# Patient Record
Sex: Male | Born: 1980 | Race: Black or African American | Hispanic: No | Marital: Single | Smoking: Never smoker
Health system: Southern US, Community
[De-identification: ages and names within clinical notes are randomized; demographics above are authoritative.]

---

## 2005-02-12 ENCOUNTER — Emergency Department: Payer: Self-pay | Admitting: Emergency Medicine

## 2005-04-10 ENCOUNTER — Emergency Department: Payer: Self-pay | Admitting: Emergency Medicine

## 2006-11-09 ENCOUNTER — Emergency Department: Payer: Self-pay | Admitting: Emergency Medicine

## 2009-04-30 ENCOUNTER — Emergency Department: Payer: Self-pay | Admitting: Emergency Medicine

## 2010-05-21 ENCOUNTER — Emergency Department: Payer: Self-pay | Admitting: Emergency Medicine

## 2016-04-07 ENCOUNTER — Encounter (HOSPITAL_COMMUNITY): Payer: Self-pay | Admitting: *Deleted

## 2016-04-07 ENCOUNTER — Emergency Department (HOSPITAL_COMMUNITY)
Admission: EM | Admit: 2016-04-07 | Discharge: 2016-04-07 | Disposition: A | Discharge status: Home / Self Care | Payer: No Typology Code available for payment source | Attending: Emergency Medicine | Admitting: Emergency Medicine

## 2016-04-07 ENCOUNTER — Emergency Department (HOSPITAL_COMMUNITY): Payer: No Typology Code available for payment source

## 2016-04-07 DIAGNOSIS — Y999 Unspecified external cause status: Secondary | ICD-10-CM | POA: Diagnosis not present

## 2016-04-07 DIAGNOSIS — M545 Low back pain, unspecified: Secondary | ICD-10-CM

## 2016-04-07 DIAGNOSIS — Y9389 Activity, other specified: Secondary | ICD-10-CM | POA: Diagnosis not present

## 2016-04-07 DIAGNOSIS — Y92410 Unspecified street and highway as the place of occurrence of the external cause: Secondary | ICD-10-CM | POA: Insufficient documentation

## 2016-04-07 MED ORDER — DICLOFENAC SODIUM 50 MG PO TBEC: 50.0000 mg | DELAYED_RELEASE_TABLET | Freq: Two times a day (BID) | ORAL | Status: DC

## 2016-04-07 MED ORDER — METHOCARBAMOL 500 MG PO TABS: 500.0000 mg | ORAL_TABLET | Freq: Four times a day (QID) | ORAL | Status: DC
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
## 2016-04-07 NOTE — Discharge Instructions (Signed)
°Back Pain, Adult °Back pain is very common in adults. The cause of back pain is rarely dangerous and the pain often gets better over time. The cause of your back pain may not be known. Some common causes of back pain include: °· Strain of the muscles or ligaments supporting the spine. °· Wear and tear (degeneration) of the spinal disks. °· Arthritis. °· Direct injury to the back. °For many people, back pain may return. Since back pain is rarely dangerous, most people can learn to manage this condition on their own. °HOME CARE INSTRUCTIONS °Watch your back pain for any changes. The following actions may help to lessen any discomfort you are feeling: °· Remain active. It is stressful on your back to sit or stand in one place for long periods of time. Do not sit, drive, or stand in one place for more than 30 minutes at a time. Take short walks on even surfaces as soon as you are able. Try to increase the length of time you walk each day. °· Exercise regularly as directed by your health care provider. Exercise helps your back heal faster. It also helps avoid future injury by keeping your muscles strong and flexible. °· Do not stay in bed. Resting more than 1-2 days can delay your recovery. °· Pay attention to your body when you bend and lift. The most comfortable positions are those that put less stress on your recovering back. Always use proper lifting techniques, including: °¨ Bending your knees. °¨ Keeping the load close to your body. °¨ Avoiding twisting. °· Find a comfortable position to sleep. Use a firm mattress and lie on your side with your knees slightly bent. If you lie on your back, put a pillow under your knees. °· Avoid feeling anxious or stressed. Stress increases muscle tension and can worsen back pain. It is important to recognize when you are anxious or stressed and learn ways to manage it, such as with exercise. °· Take medicines only as directed by your health care provider. Over-the-counter  medicines to reduce pain and inflammation are often the most helpful. Your health care provider may prescribe muscle relaxant drugs. These medicines help dull your pain so you can more quickly return to your normal activities and healthy exercise. °· Apply ice to the injured area: °¨ Put ice in a plastic bag. °¨ Place a towel between your skin and the bag. °¨ Leave the ice on for 20 minutes, 2-3 times a day for the first 2-3 days. After that, ice and heat may be alternated to reduce pain and spasms. °· Maintain a healthy weight. Excess weight puts extra stress on your back and makes it difficult to maintain good posture. °SEEK MEDICAL CARE IF: °· You have pain that is not relieved with rest or medicine. °· You have increasing pain going down into the legs or buttocks. °· You have pain that does not improve in one week. °· You have night pain. °· You lose weight. °· You have a fever or chills. °SEEK IMMEDIATE MEDICAL CARE IF:  °· You develop new bowel or bladder control problems. °· You have unusual weakness or numbness in your arms or legs. °· You develop nausea or vomiting. °· You develop abdominal pain. °· You feel faint. °  °This information is not intended to replace advice given to you by your health care provider. Make sure you discuss any questions you have with your health care provider. °  °Document Released: 10/20/2005 Document Revised: 11/10/2014 Document Reviewed: 02/21/2014 °Elsevier Interactive Patient Education ©2016 [employer]   [employer] °Motor Vehicle Collision °It is common to have multiple bruises and sore muscles after a motor vehicle collision (MVC). These tend to feel worse for the first 24 hours. You may have the most stiffness and soreness over the first several hours. You may also feel worse when you wake up the first morning after your collision. After this point, you will usually begin to improve with each day. The speed of improvement often depends on the severity of the collision, the number of  injuries, and the location and nature of these injuries. °HOME CARE INSTRUCTIONS °· Put ice on the injured area. °· Put ice in a plastic bag. °· Place a towel between your skin and the bag. °· Leave the ice on for 15-20 minutes, 3-4 times a day, or as directed by your health care provider. °· Drink enough fluids to keep your urine clear or pale yellow. Do not drink alcohol. °· Take a warm shower or bath once or twice a day. This will increase blood flow to sore muscles. °· You may return to activities as directed by your caregiver. Be careful when lifting, as this may aggravate neck or back pain. °· Only take over-the-counter or prescription medicines for pain, discomfort, or fever as directed by your caregiver. Do not use aspirin. This may increase bruising and bleeding. °SEEK IMMEDIATE MEDICAL CARE IF: °· You have numbness, tingling, or weakness in the arms or legs. °· You develop severe headaches not relieved with medicine. °· You have severe neck pain, especially tenderness in the middle of the back of your neck. °· You have changes in bowel or bladder control. °· There is increasing pain in any area of the body. °· You have shortness of breath, light-headedness, dizziness, or fainting. °· You have chest pain. °· You feel sick to your stomach (nauseous), throw up (vomit), or sweat. °· You have increasing abdominal discomfort. °· There is blood in your urine, stool, or vomit. °· You have pain in your shoulder (shoulder strap areas). °· You feel your symptoms are getting worse. °MAKE SURE YOU: °· Understand these instructions. °· Will watch your condition. °· Will get help right away if you are not doing well or get worse. °  °This information is not intended to replace advice given to you by your health care provider. Make sure you discuss any questions you have with your health care provider. °  °Document Released: 10/20/2005 Document Revised: 11/10/2014 Document Reviewed: 03/19/2011 °Elsevier Interactive  Patient Education ©2016 [employer]vier Inc. ° °
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
## 2019-06-02 ENCOUNTER — Encounter: Payer: Self-pay | Admitting: Emergency Medicine

## 2019-06-02 ENCOUNTER — Other Ambulatory Visit: Payer: Self-pay

## 2019-06-02 ENCOUNTER — Emergency Department: Payer: No Typology Code available for payment source

## 2019-06-02 ENCOUNTER — Emergency Department
Admission: EM | Admit: 2019-06-02 | Discharge: 2019-06-02 | Disposition: A | Discharge status: Home / Self Care | Payer: No Typology Code available for payment source | Attending: Emergency Medicine | Admitting: Emergency Medicine

## 2019-06-02 DIAGNOSIS — Y9389 Activity, other specified: Secondary | ICD-10-CM | POA: Insufficient documentation

## 2019-06-02 DIAGNOSIS — S8992XA Unspecified injury of left lower leg, initial encounter: Secondary | ICD-10-CM | POA: Diagnosis present

## 2019-06-02 DIAGNOSIS — Z79899 Other long term (current) drug therapy: Secondary | ICD-10-CM | POA: Insufficient documentation

## 2019-06-02 DIAGNOSIS — S8002XA Contusion of left knee, initial encounter: Secondary | ICD-10-CM | POA: Insufficient documentation

## 2019-06-02 DIAGNOSIS — Y998 Other external cause status: Secondary | ICD-10-CM | POA: Diagnosis not present

## 2019-06-02 DIAGNOSIS — Y929 Unspecified place or not applicable: Secondary | ICD-10-CM | POA: Insufficient documentation

## 2019-06-02 NOTE — Discharge Instructions (Addendum)
Follow discharge care instruction use over-the-counter ibuprofen or Tylenol as needed for pain.
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
## 2019-07-11 ENCOUNTER — Encounter: Payer: Self-pay | Admitting: Emergency Medicine

## 2019-07-11 ENCOUNTER — Emergency Department: Payer: No Typology Code available for payment source

## 2019-07-11 ENCOUNTER — Emergency Department
Admission: EM | Admit: 2019-07-11 | Discharge: 2019-07-11 | Disposition: A | Discharge status: Home / Self Care | Payer: No Typology Code available for payment source | Attending: Emergency Medicine | Admitting: Emergency Medicine

## 2019-07-11 ENCOUNTER — Other Ambulatory Visit: Payer: Self-pay

## 2019-07-11 DIAGNOSIS — Y929 Unspecified place or not applicable: Secondary | ICD-10-CM | POA: Diagnosis not present

## 2019-07-11 DIAGNOSIS — S29012A Strain of muscle and tendon of back wall of thorax, initial encounter: Secondary | ICD-10-CM | POA: Insufficient documentation

## 2019-07-11 DIAGNOSIS — Y999 Unspecified external cause status: Secondary | ICD-10-CM | POA: Diagnosis not present

## 2019-07-11 DIAGNOSIS — Y939 Activity, unspecified: Secondary | ICD-10-CM | POA: Insufficient documentation

## 2019-07-11 DIAGNOSIS — M436 Torticollis: Secondary | ICD-10-CM | POA: Insufficient documentation

## 2019-07-11 DIAGNOSIS — X58XXXA Exposure to other specified factors, initial encounter: Secondary | ICD-10-CM | POA: Diagnosis not present

## 2019-07-11 DIAGNOSIS — S29019A Strain of muscle and tendon of unspecified wall of thorax, initial encounter: Secondary | ICD-10-CM

## 2019-07-11 DIAGNOSIS — Z20828 Contact with and (suspected) exposure to other viral communicable diseases: Secondary | ICD-10-CM | POA: Diagnosis not present

## 2019-07-11 DIAGNOSIS — S299XXA Unspecified injury of thorax, initial encounter: Secondary | ICD-10-CM | POA: Diagnosis present

## 2019-07-11 LAB — SARS CORONAVIRUS 2 (TAT 6-24 HRS): SARS Coronavirus 2: NEGATIVE

## 2019-07-11 MED ORDER — MELOXICAM 15 MG PO TABS: 15.0000 mg | ORAL_TABLET | Freq: Every day | ORAL | 0 refills | Status: DC

## 2019-07-11 MED ORDER — TIZANIDINE HCL 4 MG PO TABS: 4.0000 mg | ORAL_TABLET | Freq: Three times a day (TID) | ORAL | 0 refills | Status: AC

## 2019-07-11 MED ORDER — KETOROLAC TROMETHAMINE 30 MG/ML IJ SOLN
30.0000 mg | Freq: Once | INTRAMUSCULAR | Status: DC
  Filled 2019-07-11: qty 1

## 2019-07-11 MED ORDER — IBUPROFEN 600 MG PO TABS
600.0000 mg | ORAL_TABLET | Freq: Once | ORAL | Status: AC
  Administered 2019-07-11: 600 mg via ORAL
  Filled 2019-07-11: qty 1

## 2019-07-11 MED ORDER — TRAMADOL HCL 50 MG PO TABS: 50.0000 mg | ORAL_TABLET | Freq: Four times a day (QID) | ORAL | 0 refills | Status: DC | PRN

## 2019-07-11 NOTE — ED Provider Notes (Signed)
[facility] Emergency Department Provider Note ____________________________________________  Time seen: Approximately 10:16 AM  I have reviewed the triage vital signs and the nursing notes.   HISTORY  Chief Complaint Back Pain    HPI Alan Villanueva is a 38 y.o. male who presents to the emergency department for evaluation and treatment of pain in neck, shoulder and behind the right shoulder blade. He awakened with the pain several days ago. No relief with Aleve.   Patient also requests COVID 19 testing. He is asymptomatic without known exposure.  History reviewed. No pertinent past medical history.  There are no active problems to display for this patient.   History reviewed. No pertinent surgical history.  Prior to Admission medications   Medication Sig Start Date End Date Taking? Authorizing Provider  meloxicam (MOBIC) 15 MG tablet Take 1 tablet (15 mg total) by mouth daily. 07/11/19   Nataliyah Packham B, FNP  tiZANidine (ZANAFLEX) 4 MG tablet Take 1 tablet (4 mg total) by mouth 3 (three) times daily. 07/11/19 07/10/20  Ahan Eisenberger, Kasandra Knudsenari B, FNP  traMADol (ULTRAM) 50 MG tablet Take 1 tablet (50 mg total) by mouth every 6 (six) hours as needed. 07/11/19   Chinita Pesterriplett, Keylon Labelle B, FNP    Allergies Patient has no known allergies.  No family history on file.  Social History Social History   Tobacco Use  . Smoking status: Never Smoker  . Smokeless tobacco: Never Used  Substance Use Topics  . Alcohol use: No  . Drug use: Yes    Types: Marijuana    Review of Systems Constitutional: Negative for fever. Cardiovascular: Negative for chest pain. Respiratory: Negative for shortness of breath. Musculoskeletal: Positive for neck, shoulder, and back pain Skin: Negative for open wounds/lesions.  Neurological: Negative for decrease in sensation  ____________________________________________   PHYSICAL EXAM:  VITAL SIGNS: ED Triage Vitals [07/11/19 0953]  Enc  Vitals Group     BP 129/67     Pulse Rate 77     Resp 16     Temp 99 F (37.2 C)     Temp Source Oral     SpO2 99 %     Weight 167 lb (75.8 kg)     Height 5\' 6"  (1.676 m)     Head Circumference      Peak Flow      Pain Score 9     Pain Loc      Pain Edu?      Excl. in GC?     Constitutional: Alert and oriented. Well appearing and in no acute distress. Eyes: Conjunctivae are clear without discharge or drainage Head: Atraumatic Neck: Pain with ROM. Respiratory: No cough. Respirations are even and unlabored. Musculoskeletal: Subscapular tenderness and tenderness over the paraspinal muscles of the mid thoracic area.  Neurologic: Awake, alert, and oriented.  Skin: No wounds or lesions over area of concern.  Psychiatric: Affect and behavior are appropriate.  ____________________________________________   LABS (all labs ordered are listed, but only abnormal results are displayed)  Labs Reviewed  SARS CORONAVIRUS 2 (TAT 6-24 HRS)   ____________________________________________  RADIOLOGY  Image of the thoracic spine is negative for acute findings per radiology. ____________________________________________   PROCEDURES  Procedures  ____________________________________________   INITIAL IMPRESSION / ASSESSMENT AND PLAN / ED COURSE  Alan Villanueva is a 38 y.o. who presents to the emergency department for with moderate evaluation of neck, right shoulder, and right side thoracic back pain.  See HPI for further details.  Patient refused injection of Toradol.  Ibuprofen was given.  He will be discharged home with prescriptions for Zanaflex, tramadol, and meloxicam.   COVID-19 test will be performed prior to discharge.  Patient is to follow-up with primary care provider of his choice or return to the emergency department for symptoms of concern.  Medications  ketorolac (TORADOL) 30 MG/ML injection 30 mg (30 mg Intramuscular Not Given 07/11/19 1042)  ibuprofen (ADVIL)  tablet 600 mg (600 mg Oral Given 07/11/19 1055)    Pertinent labs & imaging results that were available during my care of the patient were reviewed by me and considered in my medical decision making (see chart for details).  _________________________________________   FINAL CLINICAL IMPRESSION(S) / ED DIAGNOSES  Final diagnoses:  Acute torticollis  Thoracic myofascial strain, initial encounter    ED Discharge Orders         Ordered    tiZANidine (ZANAFLEX) 4 MG tablet  3 times daily     07/11/19 1053    traMADol (ULTRAM) 50 MG tablet  Every 6 hours PRN     07/11/19 1053    meloxicam (MOBIC) 15 MG tablet  Daily     07/11/19 1053           If controlled substance prescribed during this visit, 12 month history viewed on the [facility] prior to issuing an initial prescription for Schedule II or III opiod.   Victorino Dike, FNP 07/11/19 1119    Harvest Dark, MD 07/12/19 1408
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
## 2019-07-11 NOTE — Discharge Instructions (Signed)
Please follow up with the primary care provider of your choice or return to the ER for symptoms of concern.  Apply ice to the neck and back off an on today.

## 2019-07-11 NOTE — ED Notes (Signed)
See triage note  Presents with upper back pain for about 1 week  States pain started at neck and moves into upper back and anterior shoulder area  Also states he wants to be tested for COVID  States he has not been exposed but his wife was but she test neg
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
## 2020-10-05 ENCOUNTER — Ambulatory Visit: Payer: Self-pay

## 2020-10-08 ENCOUNTER — Other Ambulatory Visit: Payer: Self-pay

## 2020-10-08 ENCOUNTER — Ambulatory Visit: Payer: Self-pay

## 2020-10-08 ENCOUNTER — Emergency Department
Admission: EM | Admit: 2020-10-08 | Discharge: 2020-10-08 | Disposition: A | Discharge status: Home / Self Care | Payer: Self-pay | Attending: Student in an Organized Health Care Education/Training Program | Admitting: Student in an Organized Health Care Education/Training Program

## 2020-10-08 DIAGNOSIS — J029 Acute pharyngitis, unspecified: Secondary | ICD-10-CM | POA: Insufficient documentation

## 2020-10-08 DIAGNOSIS — R197 Diarrhea, unspecified: Secondary | ICD-10-CM | POA: Insufficient documentation

## 2020-10-08 DIAGNOSIS — A09 Infectious gastroenteritis and colitis, unspecified: Secondary | ICD-10-CM

## 2020-10-08 DIAGNOSIS — R109 Unspecified abdominal pain: Secondary | ICD-10-CM | POA: Insufficient documentation

## 2020-10-08 LAB — URINALYSIS, ROUTINE W REFLEX MICROSCOPIC
Bacteria, UA: NONE SEEN
Bilirubin Urine: NEGATIVE
Glucose, UA: NEGATIVE mg/dL
Ketones, ur: NEGATIVE mg/dL
Leukocytes,Ua: NEGATIVE
Nitrite: NEGATIVE
Protein, ur: NEGATIVE mg/dL
Specific Gravity, Urine: 1.019 (ref 1.005–1.030)
Squamous Epithelial / HPF: NONE SEEN (ref 0–5)
pH: 5 (ref 5.0–8.0)

## 2020-10-08 LAB — BASIC METABOLIC PANEL
Anion gap: 8 (ref 5–15)
BUN: 8 mg/dL (ref 6–20)
CO2: 26 mmol/L (ref 22–32)
Calcium: 9.3 mg/dL (ref 8.9–10.3)
Chloride: 104 mmol/L (ref 98–111)
Creatinine, Ser: 1.13 mg/dL (ref 0.61–1.24)
GFR, Estimated: >60 mL/min (ref >60)
Glucose, Bld: 91 mg/dL (ref 70–99)
Potassium: 3.8 mmol/L (ref 3.5–5.1)
Sodium: 138 mmol/L (ref 135–145)

## 2020-10-08 LAB — CBC
HCT: 37.3 % — ABNORMAL LOW (ref 39.0–52.0)
Hemoglobin: 12.5 g/dL — ABNORMAL LOW (ref 13.0–17.0)
MCH: 32.4 pg (ref 26.0–34.0)
MCHC: 33.5 g/dL (ref 30.0–36.0)
MCV: 96.6 fL (ref 80.0–100.0)
Platelets: 275 10*3/uL (ref 150–400)
RBC: 3.86 MIL/uL — ABNORMAL LOW (ref 4.22–5.81)
RDW: 12.9 % (ref 11.5–15.5)
WBC: 7.8 10*3/uL (ref 4.0–10.5)
nRBC: 0 % (ref 0.0–0.2)

## 2020-10-08 LAB — LIPASE, BLOOD: Lipase: 78 U/L — ABNORMAL HIGH (ref 11–51)

## 2020-10-08 LAB — GROUP A STREP BY PCR: Group A Strep by PCR: NOT DETECTED

## 2020-10-08 MED ORDER — AMOXICILLIN 875 MG PO TABS: 875.0000 mg | ORAL_TABLET | Freq: Two times a day (BID) | ORAL | 0 refills | Status: AC

## 2020-10-08 MED ORDER — AMOXICILLIN 875 MG PO TABS: 875.0000 mg | ORAL_TABLET | Freq: Two times a day (BID) | ORAL | 0 refills | Status: DC

## 2020-10-08 MED ORDER — AMOXICILLIN 500 MG PO CAPS
500.0000 mg | ORAL_CAPSULE | Freq: Once | ORAL | Status: AC
  Administered 2020-10-08: 500 mg via ORAL
  Filled 2020-10-08: qty 1

## 2020-10-08 NOTE — ED Triage Notes (Signed)
Pt here with a sore throat and abd pain. Pt states that he has had diarrhea for a week. Pt NAD in triage.

## 2020-10-08 NOTE — Discharge Instructions (Signed)
Follow-up with your regular doctor if not improving in 2 to 3 days.  Return emergency department worsening.  Take amoxicillin as prescribed.

## 2020-10-08 NOTE — ED Provider Notes (Signed)
[facility] Emergency Department Provider Note  ____________________________________________   First MD Initiated Contact with Patient 10/08/20 2156     (approximate)  I have reviewed the triage vital signs and the nursing notes.   HISTORY  Chief Complaint Sore Throat    HPI Alan Villanueva is a 39 y.o. male presents emergency department with sore throat and abdominal pain.  Patient states he has had diarrhea for approximately 1 week.  Sore throat with white patches.  Noticed the white patches today.  No known Covid exposure.  States he gets Covid test frequently at work and it was negative.  Denies chest pain shortness of breath or vomiting.  States diarrhea has just been loose stools.    No past medical history on file.  There are no problems to display for this patient.   No past surgical history on file.  Prior to Admission medications   Medication Sig Start Date End Date Taking? Authorizing Provider  amoxicillin (AMOXIL) 875 MG tablet Take 1 tablet (875 mg total) by mouth 2 (two) times daily. 10/08/20   Faythe Ghee, PA-C    Allergies Patient has no known allergies.  No family history on file.  Social History Social History   Tobacco Use  . Smoking status: Never Smoker  . Smokeless tobacco: Never Used  Substance Use Topics  . Alcohol use: No  . Drug use: Yes    Types: Marijuana    Review of Systems  Constitutional: No fever/chills Eyes: No visual changes. ENT: Positive sore throat. Respiratory: Denies cough Cardiovascular: Denies chest pain Gastrointestinal: Positive abdominal pain, positive diarrhea Genitourinary: Negative for dysuria. Musculoskeletal: Negative for back pain. Skin: Negative for rash. Psychiatric: no mood changes,     ____________________________________________   PHYSICAL EXAM:  VITAL SIGNS: ED Triage Vitals  Enc Vitals Group     BP 10/08/20 1732 133/71     Pulse Rate 10/08/20 1732 65      Resp 10/08/20 1732 18     Temp 10/08/20 1732 98.9 F (37.2 C)     Temp Source 10/08/20 1732 Oral     SpO2 10/08/20 1732 98 %     Weight 10/08/20 1731 161 lb (73 kg)     Height 10/08/20 1731 5\' 6"  (1.676 m)     Head Circumference --      Peak Flow --      Pain Score 10/08/20 1731 5     Pain Loc --      Pain Edu? --      Excl. in [place]? --     Constitutional: Alert and oriented. Well appearing and in no acute distress. Eyes: Conjunctivae are normal.  Head: Atraumatic. Nose: No congestion/rhinnorhea. Mouth/Throat: Mucous membranes are moist.   Neck:  supple no lymphadenopathy noted Cardiovascular: Normal rate, regular rhythm. Heart sounds are normal Respiratory: Normal respiratory effort.  No retractions, lungs c t a  Abd: soft minimally tender in lower quadrants bilaterally, no McBurney's point tenderness noted, bs normal all 4 quad GU: deferred Musculoskeletal: FROM all extremities, warm and well perfused Neurologic:  Normal speech and language.  Skin:  Skin is warm, dry and intact. No rash noted. Psychiatric: Mood and affect are normal. Speech and behavior are normal.  ____________________________________________   LABS (all labs ordered are listed, but only abnormal results are displayed)  Labs Reviewed  CBC - Abnormal; Notable for the following components:      Result Value   RBC 3.86 (*)  Hemoglobin 12.5 (*)    HCT 37.3 (*)    All other components within normal limits  LIPASE, BLOOD - Abnormal; Notable for the following components:   Lipase 78 (*)    All other components within normal limits  URINALYSIS, ROUTINE W REFLEX MICROSCOPIC - Abnormal; Notable for the following components:   Color, Urine YELLOW (*)    APPearance CLEAR (*)    Hgb urine dipstick SMALL (*)    All other components within normal limits  GROUP A STREP BY PCR  BASIC METABOLIC PANEL    ____________________________________________   ____________________________________________  RADIOLOGY    ____________________________________________   PROCEDURES  Procedure(s) performed: No  Procedures    ____________________________________________   INITIAL IMPRESSION / ASSESSMENT AND PLAN / ED COURSE  Pertinent labs & imaging results that were available during my care of the patient were reviewed by me and considered in my medical decision making (see chart for details).   Patient is a 39 year old male presents with sore throat and diarrhea.  See HPI.  Patient does admit to drinking a lot of alcohol over the weekend.  Physical exam shows patient to appear stable.  Vitals normal.  Throat is red and swollen, lower quads are tender bilaterally no McBurney's point tenderness noted.  DDx: Strep throat, acute gastroenteritis, Covid, viral syndrome  CBC is basically normal, basic metabolic panel is normal, urinalysis is normal, lipase is slightly elevated at 78 which would correspond to the amount of alcohol he drinks weekend.  Strep test is negative  Did not really do a Covid test at this time as patient had one recently.  I do feel this is mostly a viral entity with concerns of strep throat that was not detected on strep test.  He was given amoxicillin while here in the ED.  Prescription for amoxicillin.  He is to follow-up with his regular doctor if not improving in 2 to 3 days.  Return emergency department if worsening.  He is to drink fluids.  Take over-the-counter Imodium A-D and eat a BRAT diet.  Also encouraged him to eat yogurt or take a probiotic while taking the antibiotic for the pharyngitis.  He states he understands.  He was discharged stable condition and given a work note.     Alan Villanueva was evaluated in Emergency Department on 10/08/2020 for the symptoms described in the history of present illness. He was evaluated in the context of the global COVID-19  pandemic, which necessitated consideration that the patient might be at risk for infection with the SARS-CoV-2 virus that causes COVID-19. Institutional protocols and algorithms that pertain to the evaluation of patients at risk for COVID-19 are in a state of rapid change based on information released by regulatory bodies including the CDC and federal and state organizations. These policies and algorithms were followed during the patient's care in the ED.    As part of my medical decision making, I reviewed the following data within the electronic MEDICAL RECORD NUMBER Nursing notes reviewed and incorporated, Labs reviewed , Old chart reviewed, Notes from prior ED visits and [place] Controlled Substance Database  ____________________________________________   FINAL CLINICAL IMPRESSION(S) / ED DIAGNOSES  Final diagnoses:  Acute pharyngitis, unspecified etiology  Diarrhea of infectious origin      NEW MEDICATIONS STARTED DURING THIS VISIT:  Discharge Medication List as of 10/08/2020 10:07 PM       Note:  This document was prepared using Dragon voice recognition software and may include unintentional dictation errors.  Faythe Ghee, PA-C 10/08/20 2242    Willy Eddy, MD 10/09/20 [phone]

## 2022-08-15 ENCOUNTER — Emergency Department: Payer: 59

## 2022-08-15 ENCOUNTER — Encounter: Payer: Self-pay | Admitting: Emergency Medicine

## 2022-08-15 ENCOUNTER — Other Ambulatory Visit: Payer: Self-pay

## 2022-08-15 ENCOUNTER — Emergency Department
Admission: EM | Admit: 2022-08-15 | Discharge: 2022-08-15 | Disposition: A | Discharge status: Home / Self Care | Payer: 59 | Attending: Student in an Organized Health Care Education/Training Program | Admitting: Student in an Organized Health Care Education/Training Program

## 2022-08-15 DIAGNOSIS — J069 Acute upper respiratory infection, unspecified: Secondary | ICD-10-CM | POA: Insufficient documentation

## 2022-08-15 DIAGNOSIS — R059 Cough, unspecified: Secondary | ICD-10-CM | POA: Diagnosis present

## 2022-08-15 DIAGNOSIS — Z20822 Contact with and (suspected) exposure to covid-19: Secondary | ICD-10-CM | POA: Diagnosis not present

## 2022-08-15 DIAGNOSIS — J22 Unspecified acute lower respiratory infection: Secondary | ICD-10-CM

## 2022-08-15 LAB — RESP PANEL BY RT-PCR (FLU A&B, COVID) ARPGX2
Influenza A by PCR: NEGATIVE
Influenza B by PCR: NEGATIVE
SARS Coronavirus 2 by RT PCR: NEGATIVE

## 2022-08-15 MED ORDER — ALBUTEROL SULFATE HFA 108 (90 BASE) MCG/ACT IN AERS: 2.0000 | INHALATION_SPRAY | Freq: Four times a day (QID) | RESPIRATORY_TRACT | 2 refills | Status: AC | PRN

## 2022-08-15 NOTE — Discharge Instructions (Signed)
-  You may utilize the inhaler as needed for cough/shortness of breath.  -You may utilize other over the counter medications as needed for sinus congestion, body aches, or cough.  -Return to the emergency department at any time if you begin to experience any new or worsening symptoms.

## 2022-08-15 NOTE — ED Triage Notes (Signed)
C/O  mid chest pain x 2 weeks.  Usually when working in the mornings.  States this morning felt as if he could not take a deep breath and deep breathing caused the chest pain.  Denies cough.  AAOx3.  Skin warm and dry. NAD  Significant other has covid -- tested positive

## 2022-08-15 NOTE — ED Provider Notes (Signed)
[facility] Provider Note    Event Date/Time   First MD Initiated Contact with Patient 08/15/22 1442     (approximate)   History   Chief Complaint No chief complaint on file.   HPI Alan Villanueva is a 41 y.o. male, no significant medical history, presents to the ED for evaluation of chest pain x2 weeks.  Patient states that he has been having cough, congestion, and tightness with deep breathing over the past week.  He states that his significant other has tested positive for COVID-19 and believes he may have a 2.  Denies fever/chills, abdominal pain, flank pain, nausea/vomiting, diarrhea, dysuria, headache, dizziness/lightheadedness, rash/lesions, or numbness/tingling upper or lower extremities.  History Limitations: No limitations.        Physical Exam  Triage Vital Signs: ED Triage Vitals  Enc Vitals Group     BP 08/15/22 1420 118/78     Pulse Rate 08/15/22 1420 62     Resp 08/15/22 1420 16     Temp 08/15/22 1420 98.6 F (37 C)     Temp Source 08/15/22 1420 Oral     SpO2 08/15/22 1420 95 %     Weight 08/15/22 1413 160 lb 15 oz (73 kg)     Height 08/15/22 1413 5\' 6"  (1.676 m)     Head Circumference --      Peak Flow --      Pain Score 08/15/22 1413 0     Pain Loc --      Pain Edu? --      Excl. in [place]? --     Most recent vital signs: Vitals:   08/15/22 1420  BP: 118/78  Pulse: 62  Resp: 16  Temp: 98.6 F (37 C)  SpO2: 95%    General: Awake, NAD.  Skin: Warm, dry. No rashes or lesions.  Eyes: PERRL. Conjunctivae normal.  CV: Good peripheral perfusion.  Resp: Normal effort.  Lung sounds are clear bilaterally in the apices and bases. Abd: Soft, non-tender. No distention.  Neuro: At baseline. No gross neurological deficits.  Musculoskeletal: Normal ROM of all extremities.  Focused Exam: Throat exam unremarkable.  Uvula midline.  Physical Exam    ED Results / Procedures / Treatments  Labs (all labs ordered are listed,  but only abnormal results are displayed) Labs Reviewed  RESP PANEL BY RT-PCR (FLU A&B, COVID) [id number]     EKG N/A.    RADIOLOGY  ED Provider Interpretation: I personally viewed and interpreted this x-ray, no evidence of acute cardiopulmonary abnormalities.  DG Chest 2 View  Result Date: 08/15/2022 CLINICAL DATA:  Chest pain/shortness of breath EXAM: CHEST - 2 VIEW COMPARISON:  None Available. FINDINGS: The heart size and mediastinal contours are within normal limits. Both lungs are clear. The visualized skeletal structures are unremarkable. IMPRESSION: No active cardiopulmonary disease. Electronically Signed   By: Marin Roberts M.D.   On: 08/15/2022 14:36    PROCEDURES:  Critical Care performed: N/A.  Procedures    MEDICATIONS ORDERED IN ED: Medications - No data to display   IMPRESSION / MDM / [place] / ED COURSE  I reviewed the triage vital signs and the nursing notes.                              Differential diagnosis includes, but is not limited to, COVID-19, influenza, community-acquired pneumonia, viral URI   Assessment/Plan Presentation consistent with bronchitis, likely  viral in etiology.  Respiratory panel negative for COVID-19, though his significant other did test positive.  Suspicious for possibly false negative testing.  However, patient is otherwise healthy and does not qualify for antivirals at this time.  Chest x-ray shows no evidence of pneumonia.  Encouraged him to continue taking over-the-counter medications as needed for symptoms.  We will additionally provide him with a prescription for albuterol for his chest tightness.  Patient was amenable to this plan.  Will discharge.  Provided the patient with anticipatory guidance, return precautions, and educational material. Encouraged the patient to return to the emergency department at any time if they begin to experience any new or worsening symptoms. Patient expressed understanding and agreed  with the plan.   Patient's presentation is most consistent with acute complicated illness / injury requiring diagnostic workup.       FINAL CLINICAL IMPRESSION(S) / ED DIAGNOSES   Final diagnoses:  Lower respiratory infection (e.g., bronchitis, pneumonia, pneumonitis, pulmonitis)     Rx / DC Orders   ED Discharge Orders          Ordered    albuterol (VENTOLIN HFA) 108 (90 Base) MCG/ACT inhaler  Every 6 hours PRN        08/15/22 1448             Note:  This document was prepared using Dragon voice recognition software and may include unintentional dictation errors.   Varney Daily, [place] 08/15/22 1940    Willy Eddy, MD 08/16/22 1718

## 2022-08-15 NOTE — ED Provider Triage Note (Signed)
Emergency Medicine Provider Triage Evaluation Note  Alan Villanueva , a 41 y.o. male  was evaluated in triage.  Pt complains of chest tightness, cough, no fever, family member just diagnosed with covid.  Review of Systems  Positive:  Negative:   Physical Exam  Ht 5\' 6"  (1.676 m)   Wt 73 kg   BMI 25.98 kg/m  Gen:   Awake, no distress   Resp:  Normal effort , lungs cta MSK:   Moves extremities without difficulty  Other:    Medical Decision Making  Medically screening exam initiated at 2:21 PM.  Appropriate orders placed.  Alan Villanueva was informed that the remainder of the evaluation will be completed by another provider, this initial triage assessment does not replace that evaluation, and the importance of remaining in the ED until their evaluation is complete.  Cxr, covid test   Versie Starks, PA-C 08/15/22 1422

## 2022-12-26 ENCOUNTER — Other Ambulatory Visit: Payer: Self-pay

## 2022-12-26 ENCOUNTER — Emergency Department
Admission: EM | Admit: 2022-12-26 | Discharge: 2022-12-26 | Disposition: A | Discharge status: Home / Self Care | Payer: 59 | Attending: Emergency Medicine | Admitting: Emergency Medicine

## 2022-12-26 DIAGNOSIS — B349 Viral infection, unspecified: Secondary | ICD-10-CM | POA: Diagnosis not present

## 2022-12-26 DIAGNOSIS — Z202 Contact with and (suspected) exposure to infections with a predominantly sexual mode of transmission: Secondary | ICD-10-CM

## 2022-12-26 DIAGNOSIS — R5383 Other fatigue: Secondary | ICD-10-CM | POA: Diagnosis present

## 2022-12-26 DIAGNOSIS — Z20822 Contact with and (suspected) exposure to covid-19: Secondary | ICD-10-CM | POA: Insufficient documentation

## 2022-12-26 LAB — CHLAMYDIA/NGC RT PCR (ARMC ONLY)
Chlamydia Tr: NOT DETECTED
N gonorrhoeae: NOT DETECTED

## 2022-12-26 LAB — HIV ANTIBODY (ROUTINE TESTING W REFLEX): HIV Screen 4th Generation wRfx: NONREACTIVE

## 2022-12-26 LAB — RESP PANEL BY RT-PCR (RSV, FLU A&B, COVID)  RVPGX2
Influenza A by PCR: NEGATIVE
Influenza B by PCR: NEGATIVE
Resp Syncytial Virus by PCR: NEGATIVE
SARS Coronavirus 2 by RT PCR: NEGATIVE

## 2022-12-26 LAB — GROUP A STREP BY PCR: Group A Strep by PCR: NOT DETECTED

## 2022-12-26 MED ORDER — ACETAMINOPHEN 500 MG PO TABS
1000.0000 mg | ORAL_TABLET | Freq: Once | ORAL | Status: AC
  Administered 2022-12-26: 1000 mg via ORAL
  Filled 2022-12-26: qty 2

## 2022-12-26 NOTE — Discharge Instructions (Signed)
Your COVID flu and influenza test were negative as was your strep test but I suspect you likely have a viral illness.  You can take Tylenol Motrin for aches make sure you are staying hydrated and rest.  We sent test for gonorrhea, chlamydia, HIV and syphilis.  You will receive a phone call if these test positive.

## 2022-12-26 NOTE — ED Provider Notes (Signed)
[facility] Provider Note    Event Date/Time   First MD Initiated Contact with Patient 12/26/22 1426     (approximate)   History   Sore Throat   HPI  Alan Villanueva is a 42 y.o. male with no stated past medical history presents with sore throat headache fatigue and desire for STD check.  Patient tells me for about a week he has had a sore throat headache feeling very tired.  Denies fevers chills has a mild cough and congestion but no dyspnea he had some intermittent abdominal cramping but tolerating p.o.  No vomiting or diarrhea.  He had a sick contact with similar symptoms.  Patient is also requesting an STD check.  His partner is currently pregnant she received a phone call from the health department has not called them back yet and is concerned she may have tested positive for an STD.  He is not having any symptoms including no penile discharge pain or dysuria.     No past medical history on file.  There are no problems to display for this patient.    Physical Exam  Triage Vital Signs: ED Triage Vitals  Enc Vitals Group     BP 12/26/22 1430 131/75     Pulse Rate 12/26/22 1430 100     Resp 12/26/22 1430 16     Temp 12/26/22 1430 98.4 F (36.9 C)     Temp Source 12/26/22 1430 Oral     SpO2 12/26/22 1430 98 %     Weight --      Height --      Head Circumference --      Peak Flow --      Pain Score 12/26/22 1425 5     Pain Loc --      Pain Edu? --      Excl. in [place]? --     Most recent vital signs: Vitals:   12/26/22 1430  BP: 131/75  Pulse: 100  Resp: 16  Temp: 98.4 F (36.9 C)  SpO2: 98%     General: Awake, no distress.  CV:  Good peripheral perfusion.  Resp:  Normal effort.  Abd:  No distention.  Abdomen is soft nontender throughout Neuro:             Awake, Alert, Oriented x 3  Other:  Normal posterior oropharyngeal exam, no significant erythema uvula is midline no exudate   ED Results / Procedures / Treatments   Labs (all labs ordered are listed, but only abnormal results are displayed) Labs Reviewed  RESP PANEL BY RT-PCR (RSV, FLU A&B, COVID)  [id number]  GROUP A STREP BY PCR  CHLAMYDIA/NGC RT PCR ([facility] ONLY)            HIV ANTIBODY (ROUTINE TESTING W REFLEX)  RPR     EKG     RADIOLOGY    PROCEDURES:  Critical Care performed: No  Procedures   MEDICATIONS ORDERED IN ED: Medications  acetaminophen (TYLENOL) tablet 1,000 mg (has no administration in time range)     IMPRESSION / MDM / ASSESSMENT AND PLAN / ED COURSE  I reviewed the triage vital signs and the nursing notes.                              Patient's presentation is most consistent with acute, uncomplicated illness.  Differential diagnosis includes, but is not limited to, viral illness, less likely  strep pharyngitis, gonorrhea, chlamydia  Patient is a 42 year old male who is here for sore throat headache and fatigue for several days and she would like to be checked for STDs.  He has mild sore throat still tolerating p.o. mild headache without other associated neurologic symptoms and fatigue but no dyspnea no fevers.  On exam overall he looks well posterior oropharyngeal exam is reassuring there is no exudate uvula is midline no signs of PTA.  He looks well-hydrated.  Lung sounds are clear.  Abdominal exam is benign.  Suspect viral illness.  COVID and flu test and strep test are negative.  Discussed supportive measures including rest staying hydrated and NSAIDs for pain.  Patient is also requesting STD check.  He is asymptomatic but is concerned about possible exposure.  Will send GC chlamydia from urethral swab and HIV and RPR.  Given he is not having any symptoms will not treat.       FINAL CLINICAL IMPRESSION(S) / ED DIAGNOSES   Final diagnoses:  Viral illness  Possible exposure to STD     Rx / DC Orders   ED Discharge Orders     None        Note:  This document was prepared using Dragon voice  recognition software and may include unintentional dictation errors.   Rada Hay, MD 12/26/22 [phone]

## 2022-12-26 NOTE — ED Triage Notes (Signed)
Pt to ED via POV from home. Pt reports sore throat and feeling tired x1 wk. Pt denies sick contacts. Pt also requesting STD testing.

## 2022-12-27 LAB — RPR: RPR Ser Ql: NONREACTIVE
